# Patient Record
Sex: Male | Born: 1965 | Race: White | Hispanic: No | Marital: Married | Smoking: Former smoker
Health system: Southern US, Community
[De-identification: ages and names within clinical notes are randomized; demographics above are authoritative.]

## PROBLEM LIST (undated history)

## (undated) DIAGNOSIS — H9190 Unspecified hearing loss, unspecified ear: Secondary | ICD-10-CM

## (undated) HISTORY — DX: Unspecified hearing loss, unspecified ear: H91.90

---

## 2017-02-28 LAB — PULMONARY FUNCTION TEST

## 2017-04-20 ENCOUNTER — Ambulatory Visit (INDEPENDENT_AMBULATORY_CARE_PROVIDER_SITE_OTHER): Payer: Worker's Compensation | Admitting: Pulmonary Disease

## 2017-04-20 ENCOUNTER — Encounter: Payer: Self-pay | Admitting: Pulmonary Disease

## 2017-04-20 VITALS — BP 120/76 | HR 68 | Ht 69.0 in | Wt 210.0 lb

## 2017-04-20 DIAGNOSIS — R05 Cough: Secondary | ICD-10-CM

## 2017-04-20 DIAGNOSIS — R0602 Shortness of breath: Secondary | ICD-10-CM

## 2017-04-20 DIAGNOSIS — R059 Cough, unspecified: Secondary | ICD-10-CM

## 2017-04-20 MED ORDER — FLUTICASONE FUROATE-VILANTEROL 200-25 MCG/INH IN AEPB: 1.0000 | INHALATION_SPRAY | Freq: Every day | RESPIRATORY_TRACT | 0 refills | Status: DC

## 2017-04-20 MED ORDER — BENZONATATE 200 MG PO CAPS: 200.0000 mg | ORAL_CAPSULE | Freq: Three times a day (TID) | ORAL | 2 refills | Status: AC | PRN

## 2017-04-20 MED ORDER — FLUTICASONE FUROATE-VILANTEROL 200-25 MCG/INH IN AEPB: 1.0000 | INHALATION_SPRAY | Freq: Every day | RESPIRATORY_TRACT | 6 refills | Status: DC

## 2017-04-20 MED ORDER — FLUTICASONE FUROATE-VILANTEROL 200-25 MCG/INH IN AEPB: 1.0000 | INHALATION_SPRAY | Freq: Every day | RESPIRATORY_TRACT | 0 refills | Status: AC

## 2017-04-20 MED ORDER — PREDNISONE 10 MG PO TABS: ORAL_TABLET | ORAL | 0 refills | Status: DC

## 2017-04-20 MED ORDER — CHLORPHENIRAMINE MALEATE 4 MG PO TABS: 8.0000 mg | ORAL_TABLET | Freq: Three times a day (TID) | ORAL | 0 refills | Status: DC

## 2017-04-20 MED ORDER — OMEPRAZOLE 20 MG PO CPDR: 20.0000 mg | DELAYED_RELEASE_CAPSULE | Freq: Two times a day (BID) | ORAL | 6 refills | Status: DC

## 2017-04-20 NOTE — Patient Instructions (Signed)
Schedule you for high-resolution CT of the chest. Tessalon Perles for cough, use over-the-counter Robitussin We will start on chlorpheniramine 8 mg 3 times daily and Flonase nasal spray Start Prilosec 20 mg twice daily Stop the Qvar.  We will start you on Breo 200 Give a prednisone taper starting at 40 mg.  Reduce dose by 10 mg every 3 days Follow-up in 1 month with PFTs

## 2017-04-20 NOTE — Progress Notes (Signed)
Brandon GangJames W Mooneyham    [id number]    08-07-1965  Primary Care Physician:System, Pcp Not In  Referring Physician: No referring provider defined for this encounter.  Chief complaint: Dyspnea, occupational exposure  HPI: 52 year old with smoker with no significant history.  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  He reports that the bags had split open spilling the material in his truck and he was involved in the cleanup with no mask or other protection.  Reports developing cough, wheezing, dyspnea afterwards.  Denies any fevers, chills, sputum production, hemoptysis.  He has [facility] and Jan with a chest x-ray that was clear.  He was treated with ciprofloxacin, duo nebs and multiple rounds of prednisone.  He reports persistent symptoms and was seen again at [facility] on [facility].  Chest x-ray at that time shows mild interstitial changes consistent with bronchitis.  Spirometry did not show any obstruction, possible restriction.  He is currently on Qvar and albuterol per his primary care doctor but has persistent symptoms.  Pets: 3 dogs, no birds, cats, farm animals Occupation: Long distance truck driver Exposures: Exposure as noted above.  No mold.  Has a hot tub at home Smoking history: 10-pack-year smoking history.  Smokes a cigar every day.  Quit smoking 1 month ago Travel History: [place] in [place].  Travels all over the country for his work.  Outpatient Encounter Medications as of 04/20/2017  Medication Sig  . dexamethasone (DECADRON) 2 MG tablet   . LORazepam (ATIVAN) 1 MG tablet Take 1 mg by mouth every 8 (eight) hours.  Marland Kitchen. QVAR REDIHALER 80 MCG/ACT inhaler   . VENTOLIN HFA 108 (90 Base) MCG/ACT inhaler INHALE 2 PUFFS INTO THE LUNGS EVERY 4 HOURS AS NEEDED  FOR WHEEZING   No facility-administered encounter medications on file as of 04/20/2017.     Allergies as of 04/20/2017  . (Not on File)    Past Medical History:  Diagnosis Date  . Hearing loss     History reviewed. No pertinent surgical history.  Family History  Problem Relation Age of Onset  . Heart attack Father     Social History   Socioeconomic History  . Marital status: Married    Spouse name: Not on file  . Number of children: Not on file  . Years of education: Not on file  . Highest education level: Not on file  Social Needs  . Financial resource strain: Not on file  . Food insecurity - worry: Not on file  . Food insecurity - inability: Not on file  . Transportation needs - medical: Not on file  . Transportation needs - non-medical: Not on file  Occupational History  . Not on file  Tobacco Use  . Smoking status: Former Smoker    Packs/day: 1.00    Years: 10.00    Pack years: 10.00    Types: Cigars  . Smokeless tobacco: Never Used  Substance and Sexual Activity  . Alcohol use: No    Frequency: Never  . Drug use: No  . Sexual activity: Not on file  Other Topics Concern  . Not on file  Social History Narrative  . Not on file    Review of systems: Review of Systems  Constitutional: Negative for fever  and chills.  HENT: Negative.   Eyes: Negative for blurred vision.  Respiratory: as per HPI  Cardiovascular: Negative for chest pain and palpitations.  Gastrointestinal: Negative for vomiting, diarrhea, blood per rectum. Genitourinary: Negative for dysuria, urgency, frequency and hematuria.  Musculoskeletal: Negative for myalgias, back pain and joint pain.  Skin: Negative for itching and rash.  Neurological: Negative for dizziness, tremors, focal weakness, seizures and loss of consciousness.  Endo/Heme/Allergies: Negative for environmental allergies.  Psychiatric/Behavioral: Negative for depression, suicidal ideas and hallucinations.  All other  systems reviewed and are negative.  Physical Exam: Blood pressure 120/76, pulse 68, height 5\' 9"  (1.753 m), weight 210 lb (95.3 kg), SpO2 96 %. Gen:      No acute distress HEENT:  EOMI, sclera anicteric Neck:     No masses; no thyromegaly Lungs:    Clear to auscultation bilaterally; normal respiratory effort CV:         Regular rate and rhythm; no murmurs Abd:      + bowel sounds; soft, non-tender; no palpable masses, no distension Ext:    No edema; adequate peripheral perfusion Skin:      Warm and dry; no rash Neuro: alert and oriented x 3 Psych: normal mood and affect  Data Reviewed: Spirometry 02/28/17 FVC 2.48 [49%], FEV1 2.22 [57%], F/F 89 No obstruction, likely has restriction based on reduction in FVC.  FENO 04/21/17-unable to complete  Chest x-ray 02/14/17- clear lungs, no interstitial process Chest x-ray 04/08/17- mild increase in interstitial markings.  I have reviewed the images personally. ABG 02/14/17-7.49/30 3/1 72/100%  Assessment:  Assessment for acute dyspnea, wheezing Reports exposure to [facility] recently.  Review of literature shows that it is substitute for asbestos however there is no recorded instances of interstitial lung disease, pulmonary fibrosis after exposure  He could have acute bronchitis from exposure, suspect underlying COPD given his smoking history Has persistent cough which is likely exacerbated by postnasal drip and silent reflux He has used Tussionex in the past without any benefit. Use over-the-counter Robitussin and Tessalon for cough We will try him on chlorpheniramine antihistamine and Flonase nasal spray, start Prilosec for treatment of reflux Stop Qvar and start Breo Give another prednisone taper.    I doubt he will be able to complete PFTs right now as he has excessive coughing.  Will reassess in 1 month Get high-resolution CT for evaluation of interstitial lung disease given recent chest x-ray with interstitial opacities and  restriction on spirometry.  Plan/Recommendations: - High res CT -Tessalon Perles, Robitussin - Chlorphentermine, Flonase, Prilosec - Stop Qvar, start breo - Prednisone taper - Return in 1 month with PFTs.  Chilton Greathouse MD  Pulmonary and Critical Care Pager [phone] 04/20/2017, 11:18 AM  CC: No ref. provider found

## 2017-04-21 ENCOUNTER — Encounter: Payer: Self-pay | Admitting: Pulmonary Disease

## 2017-04-27 ENCOUNTER — Telehealth: Payer: Self-pay | Admitting: Pulmonary Disease

## 2017-04-28 NOTE — Telephone Encounter (Signed)
Spoke to [employer] auth @ is [id number] his appt is 05/02/17 Tobe SosSally E Ottinger

## 2017-04-28 NOTE — Telephone Encounter (Signed)
Lmtcb Sally E Ottinger ° ° °

## 2017-05-02 ENCOUNTER — Ambulatory Visit (INDEPENDENT_AMBULATORY_CARE_PROVIDER_SITE_OTHER)
Admission: RE | Admit: 2017-05-02 | Discharge: 2017-05-02 | Disposition: A | Discharge status: Home / Self Care | Payer: Worker's Compensation | Source: Ambulatory Visit | Attending: Pulmonary Disease | Admitting: Pulmonary Disease

## 2017-05-02 DIAGNOSIS — R0602 Shortness of breath: Secondary | ICD-10-CM

## 2017-05-10 ENCOUNTER — Telehealth: Payer: Self-pay | Admitting: Pulmonary Disease

## 2017-05-10 NOTE — Telephone Encounter (Signed)
[facility] Isaiah SergeMannam, has not received the CT results yet.

## 2017-05-12 NOTE — Telephone Encounter (Signed)
lmtcb x2 for Engineer, productionAlec nurse vase manager.

## 2017-05-12 NOTE — Telephone Encounter (Signed)
Alec nurse vase manager is calling back [phone]

## 2017-05-12 NOTE — Telephone Encounter (Signed)
Called and spoke with Erby Pianlec letting her know the CT scan has not been resulted yet but as soon as Dr. Isaiah SergeMannam reviewed the CT scan we could send the pt's previous OV as well as the CT scan results and pt's upcoming OV notes to her for their records.  Alec expressed understanding.  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  Routing this to Dr. Isaiah SergeMannam as an Lorain ChildesFYI

## 2017-05-16 NOTE — Telephone Encounter (Signed)
Still waiting on Dr. Isaiah SergeMannam to review results.

## 2017-05-17 NOTE — Telephone Encounter (Signed)
Called [place], LM to return call.

## 2017-05-17 NOTE — Telephone Encounter (Signed)
CT scan shows mild changes that may be from exposure. I am awaiting PFTs and office visit (to be done on 3/28). Will document my impression on the office visit note that day. Thanks  Chilton GreathousePraveen Threasa Kinch MD [place] [facility] 05/17/2017, 8:47 AM

## 2017-05-18 NOTE — Telephone Encounter (Signed)
Left message for Erby Pianlec to call back.

## 2017-05-18 NOTE — Telephone Encounter (Signed)
Erby Pianlec is calling back [phone]

## 2017-05-18 NOTE — Telephone Encounter (Signed)
LMTCB x 3 

## 2017-05-19 ENCOUNTER — Ambulatory Visit (INDEPENDENT_AMBULATORY_CARE_PROVIDER_SITE_OTHER): Payer: Worker's Compensation | Admitting: Pulmonary Disease

## 2017-05-19 ENCOUNTER — Encounter: Payer: Self-pay | Admitting: Pulmonary Disease

## 2017-05-19 VITALS — BP 124/76 | HR 88 | Ht 69.0 in | Wt 211.0 lb

## 2017-05-19 DIAGNOSIS — G4733 Obstructive sleep apnea (adult) (pediatric): Secondary | ICD-10-CM | POA: Diagnosis not present

## 2017-05-19 DIAGNOSIS — R05 Cough: Secondary | ICD-10-CM | POA: Diagnosis not present

## 2017-05-19 DIAGNOSIS — R059 Cough, unspecified: Secondary | ICD-10-CM

## 2017-05-19 MED ORDER — CHLORPHENIRAMINE MALEATE 4 MG PO TABS
8.0000 mg | ORAL_TABLET | Freq: Three times a day (TID) | ORAL | 3 refills | Status: AC
Start: 2017-05-19 — End: ?

## 2017-05-19 MED ORDER — PREDNISONE 10 MG PO TABS: ORAL_TABLET | ORAL | 0 refills | Status: DC

## 2017-05-19 MED ORDER — ALBUTEROL SULFATE HFA 108 (90 BASE) MCG/ACT IN AERS: 2.0000 | INHALATION_SPRAY | Freq: Four times a day (QID) | RESPIRATORY_TRACT | 6 refills | Status: DC | PRN

## 2017-05-19 MED ORDER — BUDESONIDE-FORMOTEROL FUMARATE 160-4.5 MCG/ACT IN AERO: 2.0000 | INHALATION_SPRAY | Freq: Two times a day (BID) | RESPIRATORY_TRACT | 0 refills | Status: DC

## 2017-05-19 MED ORDER — FLUTICASONE PROPIONATE 50 MCG/ACT NA SUSP: 2.0000 | Freq: Every day | NASAL | 2 refills | Status: AC

## 2017-05-19 NOTE — Addendum Note (Signed)
Addended by: Cydney OkAUGUSTIN, Sun Wilensky N on: 05/19/2017 01:03 PM   Modules accepted: Orders

## 2017-05-19 NOTE — Addendum Note (Signed)
Addended by: Cydney OkAUGUSTIN, Everardo Voris N on: 05/19/2017 01:05 PM   Modules accepted: Orders

## 2017-05-19 NOTE — Patient Instructions (Addendum)
Schedule you for a split-night sleep study Stop Breo.  We will start you on Symbicort 160.  Use 2 puffs twice daily We will give you albuterol rescue inhaler to be used as needed  Restart chlorpheniramine 8 mg 3 times daily and Flonase nasal spray Continue Prilosec 2 tablets twice daily We will give you another prednisone taper starting at 40 mg.  Reduce dose by 10 mg every 4 days. Follow-up CT in 1 year to monitor 6mm pulmonary nodule.  Follow-up in 1 month with PFTs.

## 2017-05-19 NOTE — Telephone Encounter (Signed)
lmtcb for [employer].

## 2017-05-19 NOTE — Progress Notes (Signed)
Brandon Chang    [id number]    May 08, 1965  Primary Care Physician:System, Pcp Not In  Referring Physician: No referring provider defined for this encounter.  Chief complaint: Dyspnea, cough, occupational exposure  HPI: 52 year old with smoker with no significant history.  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  He reports that the bags had split open spilling the material in his truck and he was involved in the cleanup with no mask or other protection.  Reports developing cough, wheezing, dyspnea afterwards.  Denies any fevers, chills, sputum production, hemoptysis.  He has been evaluated in the urgent care and emergency room at [facility] end of December 2018 and Jan with a chest x-ray that was clear.  He was treated with ciprofloxacin, duo nebs and multiple rounds of prednisone.  He reports persistent symptoms and was seen again at urgent care at [facility] on 04/08/17.  Chest x-ray at that time shows mild interstitial changes consistent with bronchitis.  Spirometry did not show any obstruction, possible restriction.  He is currently on Qvar and albuterol per his primary care doctor but has persistent symptoms.  Pets: 3 dogs, no birds, cats, farm animals Occupation: Long distance truck driver Exposures: Exposure as noted above.  No mold.  Has a hot tub at home Smoking history: 10-pack-year smoking history.  Smokes a cigar every day.  Quit smoking 1 month ago Travel History: Lives in [place].  Travels all over the country for his work.  Interim history: Reports mild improvement in symptoms however he continues to have paroxysms of cough.  States that dyspnea is better Has poor sleep quality, excessive daytime sleepiness, snoring.  Also has increased back spasms.  Outpatient Encounter Medications as of 05/19/2017  Medication Sig  . benzonatate  (TESSALON) 200 MG capsule Take 1 capsule (200 mg total) by mouth 3 (three) times daily as needed for cough.  . chlorpheniramine (CHLOR-TRIMETON) 4 MG tablet Take 2 tablets (8 mg total) by mouth 3 (three) times daily.  Marland Kitchen dexamethasone (DECADRON) 2 MG tablet   . fluticasone furoate-vilanterol (BREO ELLIPTA) 200-25 MCG/INH AEPB Inhale 1 puff into the lungs daily.  Marland Kitchen LORazepam (ATIVAN) 1 MG tablet Take 1 mg by mouth every 8 (eight) hours.  Marland Kitchen omeprazole (PRILOSEC) 20 MG capsule Take 1 capsule (20 mg total) by mouth 2 (two) times daily.  . VENTOLIN HFA 108 (90 Base) MCG/ACT inhaler INHALE 2 PUFFS INTO THE LUNGS EVERY 4 HOURS AS NEEDED FOR WHEEZING  . [DISCONTINUED] predniSONE (DELTASONE) 10 MG tablet 4 tabs x 3 days, 3 tabs x 3 days, 2 tabs x 3 days, 1 tab x 3 days then stop   No facility-administered encounter medications on file as of 05/19/2017.     Allergies as of 05/19/2017  . (Not on File)    Past Medical History:  Diagnosis Date  . Hearing loss     No past surgical history on file.  Family History  Problem Relation Age of Onset  . Heart attack Father     Social History   Socioeconomic History  . Marital status: Married    Spouse name: Not on file  . Number of children: Not on file  . Years of education: Not on file  . Highest education level: Not on file  Occupational History  . Not on file  Social Needs  . Financial  resource strain: Not on file  . Food insecurity:    Worry: Not on file    Inability: Not on file  . Transportation needs:    Medical: Not on file    Non-medical: Not on file  Tobacco Use  . Smoking status: Former Smoker    Packs/day: 1.00    Years: 10.00    Pack years: 10.00    Types: Cigars  . Smokeless tobacco: Never Used  . Tobacco comment: quit smoking 1y ago--04/20/17  Substance and Sexual Activity  . Alcohol use: No    Frequency: Never  . Drug use: No  . Sexual activity: Not on file  Lifestyle  . Physical activity:    Days per week: Not  on file    Minutes per session: Not on file  . Stress: Not on file  Relationships  . Social connections:    Talks on phone: Not on file    Gets together: Not on file    Attends religious service: Not on file    Active member of club or organization: Not on file    Attends meetings of clubs or organizations: Not on file    Relationship status: Not on file  . Intimate partner violence:    Fear of current or ex partner: Not on file    Emotionally abused: Not on file    Physically abused: Not on file    Forced sexual activity: Not on file  Other Topics Concern  . Not on file  Social History Narrative  . Not on file    Review of systems: Review of Systems  Constitutional: Negative for fever and chills.  HENT: Negative.   Eyes: Negative for blurred vision.  Respiratory: as per HPI  Cardiovascular: Negative for chest pain and palpitations.  Gastrointestinal: Negative for vomiting, diarrhea, blood per rectum. Genitourinary: Negative for dysuria, urgency, frequency and hematuria.  Musculoskeletal: Negative for myalgias, back pain and joint pain.  Skin: Negative for itching and rash.  Neurological: Negative for dizziness, tremors, focal weakness, seizures and loss of consciousness.  Endo/Heme/Allergies: Negative for environmental allergies.  Psychiatric/Behavioral: Negative for depression, suicidal ideas and hallucinations.  All other systems reviewed and are negative.  Physical Exam: Blood pressure 124/76, pulse 88, height 5\' 9"  (1.753 m), weight 211 lb (95.7 kg), SpO2 97 %. Gen:      No acute distress HEENT:  EOMI, sclera anicteric Neck:     No masses; no thyromegaly Lungs:    Clear to auscultation bilaterally; normal respiratory effort CV:         Regular rate and rhythm; no murmurs Abd:      + bowel sounds; soft, non-tender; no palpable masses, no distension Ext:    No edema; adequate peripheral perfusion Skin:      Warm and dry; no rash Neuro: alert and oriented x 3 Psych:  normal mood and affect  Data Reviewed: Spirometry 02/28/17 FVC 2.48 [49%], FEV1 2.22 [57%], F/F 89 No obstruction, likely has restriction based on reduction in FVC.  FENO 04/21/17-unable to complete PFTs 05/19/17-unable to complete  Chest x-ray 02/14/17- clear lungs, no interstitial process Chest x-ray 04/08/17- mild increase in interstitial markings.  CT chest 05/02/17-minimal groundglass opacities, basilar atelectasis.  6 mm left lower lobe nodule. I reviewed the images personally. ABG 02/14/17-7.49/30 3/1 72/100%  Assessment:  Follow up for dyspnea, cough [facility] recently.  Review of literature shows that it is substitute for asbestos however there is no recorded instances of interstitial lung  disease, pulmonary fibrosis after exposure.  Review of his CT scan is not convincing for interstitial lung disease.  He could have acute bronchitis from exposure, suspect underlying COPD given his smoking history Has persistent cough which is likely exacerbated by postnasal drip and silent reflux  Cough is improved since last visit but still continues to limit function. We will restart him on chlorpheniramine antihistamine, Flonase nasal spray.  Continue Prilosec for treatment of acid reflux Stop Breo as powder formulation is causing more cough.  Start Symbicort 160/45 Give another prednisone taper.  Subcentimeter pulmonary nodule He will need a follow-up CT in 1 year for evaluation  Suspected sleep apnea Untreated sleep apnea may contribute to ongoing cough Schedule for a split-night sleep study.  Asked by his case manager if he can return to work.  From a pulmonary perspective there is no clear evidence that his exposure to [facility] is causing issues however he has paroxysmal cough and also now reports excessive daytime sleepiness which may limit his ability to try heavy trucks.  I would advise that he not return to work until this has improved and he has been  evaluated for sleep apnea.  Plan/Recommendations: - Lawyer, Robitussin - Chlorphentermine, Flonase, Prilosec - Stop breo, start Symbicort - Repeat prednisone taper  - Return in 1 month with PFTs. - Follow up CT in 1 year.  - Split night sleep study  Chilton Greathouse MD [place] [facility] Pager [phone] 05/19/2017, 12:09 PM  CC: No ref. provider found

## 2017-05-19 NOTE — Progress Notes (Signed)
Patient was unable to complete PFT due to coughing and not following instructions.

## 2017-05-23 ENCOUNTER — Telehealth: Payer: Self-pay | Admitting: Pulmonary Disease

## 2017-05-23 NOTE — Telephone Encounter (Signed)
[facility] She is the pt's nurse case manager. Erby Pianlec is needing OV notes from 04/20/17 and 05/19/17, along with the pt's CT from 05/02/17. [facility]. Nothing further was needed.

## 2017-06-05 ENCOUNTER — Encounter (HOSPITAL_BASED_OUTPATIENT_CLINIC_OR_DEPARTMENT_OTHER): Payer: Self-pay

## 2017-06-20 ENCOUNTER — Ambulatory Visit (INDEPENDENT_AMBULATORY_CARE_PROVIDER_SITE_OTHER): Payer: Worker's Compensation | Admitting: Pulmonary Disease

## 2017-06-20 ENCOUNTER — Ambulatory Visit: Payer: Worker's Compensation | Admitting: Pulmonary Disease

## 2017-06-20 ENCOUNTER — Encounter: Payer: Self-pay | Admitting: Pulmonary Disease

## 2017-06-20 VITALS — BP 126/78 | HR 81 | Ht 69.0 in | Wt 204.0 lb

## 2017-06-20 DIAGNOSIS — R05 Cough: Secondary | ICD-10-CM

## 2017-06-20 DIAGNOSIS — R059 Cough, unspecified: Secondary | ICD-10-CM

## 2017-06-20 MED ORDER — ALBUTEROL SULFATE HFA 108 (90 BASE) MCG/ACT IN AERS: 2.0000 | INHALATION_SPRAY | Freq: Four times a day (QID) | RESPIRATORY_TRACT | 0 refills | Status: AC | PRN

## 2017-06-20 MED ORDER — BUDESONIDE-FORMOTEROL FUMARATE 160-4.5 MCG/ACT IN AERO: 2.0000 | INHALATION_SPRAY | Freq: Two times a day (BID) | RESPIRATORY_TRACT | 1 refills | Status: DC

## 2017-06-20 MED ORDER — OMEPRAZOLE 20 MG PO CPDR: 20.0000 mg | DELAYED_RELEASE_CAPSULE | Freq: Two times a day (BID) | ORAL | 1 refills | Status: AC

## 2017-06-20 NOTE — Progress Notes (Signed)
Unable to complete PFT. During attempt of spirometry and DLCO, pt start coughing vigorously, turning red in the face, and gagging uncontrollably. MD notified and test was terminated.

## 2017-06-20 NOTE — Progress Notes (Signed)
Brandon Chang    [id number]    07/04/1965  Primary Care Physician:System, Pcp Not In  Referring Physician: No referring provider defined for this encounter.  Chief complaint: Dyspnea, cough, occupational exposure  HPI: 52 year old with smoker with no significant history.  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  He reports that the bags had split open spilling the material in his truck and he was involved in the cleanup with no mask or other protection.  Reports developing cough, wheezing, dyspnea afterwards.  Denies any fevers, chills, sputum production, hemoptysis.  He has been evaluated in the urgent care and emergency room at [facility] end of December 2018 and Jan with a chest x-ray that was clear.  He was treated with ciprofloxacin, duo nebs and multiple rounds of prednisone.  He reports persistent symptoms and was seen again at urgent care at [facility] on 04/08/17.  Chest x-ray at that time shows mild interstitial changes consistent with bronchitis.  Spirometry did not show any obstruction, possible restriction.  He is currently on Qvar and albuterol per his primary care doctor but has persistent symptoms.  Pets: 3 dogs, no birds, cats, farm animals Occupation: Long distance truck driver Exposures: Exposure as noted above.  No mold.  Has a hot tub at home Smoking history: 10-pack-year smoking history.  Smokes a cigar every day.  Quit smoking 1 month ago Travel History: Lives in [place].  Travels all over the country for his work.  Interim history: Reports mild improvement in cough however he continues to have paroxysms of cough Denies any dyspnea. Unable to complete PFTs on 2 occasions due to ongoing cough Sleep study ordered but denied by his Worker's Compensation.  Outpatient Encounter Medications as of 06/20/2017  Medication Sig  .  albuterol (PROVENTIL HFA;VENTOLIN HFA) 108 (90 Base) MCG/ACT inhaler Inhale 2 puffs into the lungs every 6 (six) hours as needed for wheezing or shortness of breath.  . benzonatate (TESSALON) 200 MG capsule Take 1 capsule (200 mg total) by mouth 3 (three) times daily as needed for cough.  . budesonide-formoterol (SYMBICORT) 160-4.5 MCG/ACT inhaler Inhale 2 puffs into the lungs 2 (two) times daily.  . chlorpheniramine (CHLOR-TRIMETON) 4 MG tablet Take 2 tablets (8 mg total) by mouth 3 (three) times daily.  Marland Kitchen dexamethasone (DECADRON) 2 MG tablet   . fluticasone (FLONASE) 50 MCG/ACT nasal spray Place 2 sprays into both nostrils daily.  Marland Kitchen LORazepam (ATIVAN) 1 MG tablet Take 1 mg by mouth every 8 (eight) hours.  . [DISCONTINUED] chlorpheniramine (CHLOR-TRIMETON) 4 MG tablet Take 2 tablets (8 mg total) by mouth 3 (three) times daily.  . [DISCONTINUED] fluticasone furoate-vilanterol (BREO ELLIPTA) 200-25 MCG/INH AEPB Inhale 1 puff into the lungs daily.  . [DISCONTINUED] predniSONE (DELTASONE) 10 MG tablet Take 4 tabs by mouth once daily x4 days, then 3 tabs x4 days, 2 tabs x4 days, 1 tab x4 days and stop.  . [DISCONTINUED] VENTOLIN HFA 108 (90 Base) MCG/ACT inhaler INHALE 2 PUFFS INTO THE LUNGS EVERY 4 HOURS AS NEEDED FOR WHEEZING  . omeprazole (PRILOSEC) 20 MG capsule Take 1 capsule (20 mg total) by mouth 2 (two) times daily. (Patient not taking: Reported on 06/20/2017)   No facility-administered encounter medications on file as of 06/20/2017.     Allergies as of 06/20/2017  . (Not on File)    Past  Medical History:  Diagnosis Date  . Hearing loss     No past surgical history on file.  Family History  Problem Relation Age of Onset  . Heart attack Father     Social History   Socioeconomic History  . Marital status: Married    Spouse name: Not on file  . Number of children: Not on file  . Years of education: Not on file  . Highest education level: Not on file  Occupational History  .  Not on file  Social Needs  . Financial resource strain: Not on file  . Food insecurity:    Worry: Not on file    Inability: Not on file  . Transportation needs:    Medical: Not on file    Non-medical: Not on file  Tobacco Use  . Smoking status: Former Smoker    Packs/day: 1.00    Years: 10.00    Pack years: 10.00    Types: Cigars  . Smokeless tobacco: Never Used  . Tobacco comment: quit smoking 1y ago--04/20/17  Substance and Sexual Activity  . Alcohol use: No    Frequency: Never  . Drug use: No  . Sexual activity: Not on file  Lifestyle  . Physical activity:    Days per week: Not on file    Minutes per session: Not on file  . Stress: Not on file  Relationships  . Social connections:    Talks on phone: Not on file    Gets together: Not on file    Attends religious service: Not on file    Active member of club or organization: Not on file    Attends meetings of clubs or organizations: Not on file    Relationship status: Not on file  . Intimate partner violence:    Fear of current or ex partner: Not on file    Emotionally abused: Not on file    Physically abused: Not on file    Forced sexual activity: Not on file  Other Topics Concern  . Not on file  Social History Narrative  . Not on file    Review of systems: Review of Systems  Constitutional: Negative for fever and chills.  HENT: Negative.   Eyes: Negative for blurred vision.  Respiratory: as per HPI  Cardiovascular: Negative for chest pain and palpitations.  Gastrointestinal: Negative for vomiting, diarrhea, blood per rectum. Genitourinary: Negative for dysuria, urgency, frequency and hematuria.  Musculoskeletal: Negative for myalgias, back pain and joint pain.  Skin: Negative for itching and rash.  Neurological: Negative for dizziness, tremors, focal weakness, seizures and loss of consciousness.  Endo/Heme/Allergies: Negative for environmental allergies.  Psychiatric/Behavioral: Negative for depression,  suicidal ideas and hallucinations.  All other systems reviewed and are negative.  Physical Exam: Blood pressure 126/78, pulse 81, height  (1.753 m), weight 204 lb (92.5 kg), SpO2 94 %. Gen:      No acute distress HEENT:  EOMI, sclera anicteric Neck:     No masses; no thyromegaly Lungs:    Clear to auscultation bilaterally; normal respiratory effort CV:         Regular rate and rhythm; no murmurs Abd:      + bowel sounds; soft, non-tender; no palpable masses, no distension Ext:    No edema; adequate peripheral perfusion Skin:      Warm and dry; no rash Neuro: alert and oriented x 3 Psych: normal mood and affect  Data Reviewed: Spirometry 02/28/17 FVC 2.48 [49%], FEV1 2.22 [57%], F/F  89 No obstruction, likely has restriction based on reduction in FVC.  FENO 04/21/17-unable to complete PFTs 05/19/17-unable to complete PFTs 06/20/17-unable to complete  Chest x-ray 02/14/17- clear lungs, no interstitial process Chest x-ray 04/08/17- mild increase in interstitial markings.  CT chest 05/02/17-minimal groundglass opacities, basilar atelectasis.  6 mm left lower lobe nodule. I reviewed the images personally. ABG 02/14/17-7.49/30 3/1 72/100%  Assessment:  Follow up for dyspnea, cough Developed after inhalational exposure to [place]oll[place]ite in dec 2018.  Review of literature shows that it is substitute for asbestos however there is no recorded instances of interstitial lung disease, pulmonary fibrosis after exposure.  His CT scan is not convincing for interstitial lung disease.  He could have bronchitis from exposure, suspect underlying COPD given his smoking history but we have not been able to get PFTs due to cough. Has persistent cough which is likely exacerbated by postnasal drip and silent reflux  Continue chlorphentermine, Flonase nasal spray Continue Prilosec 20 mg twice daily given that he does not have overt symptoms of acid reflux On Symbicort 160/45.  Subcentimeter pulmonary  nodule He will need a follow-up CT in 1 year for evaluation  Plan/Recommendations: - Chlorphentermine, Flonase, Prilosec - Symbicort  Chilton Greathouse MD [facility] Pager [phone] 06/20/2017, 4:25 PM  CC: No ref. provider found

## 2017-06-20 NOTE — Patient Instructions (Signed)
Continue using the Symbicort Continue chlorphentermine and Flonase nasal spray Make sure that you use Prilosec twice daily Follow-up in 6 months

## 2017-07-21 ENCOUNTER — Telehealth: Payer: Self-pay | Admitting: Pulmonary Disease

## 2017-07-21 NOTE — Telephone Encounter (Signed)
Faxed pt's ct scan to Clydie Braun, nurse case mang.  Attempted to call Clydie Braun stating that this had been done but phone went straight to VM.  Left a detailed message for Clydie Braun stating that the scan had been faxed to her.  Nothing further needed at this time.

## 2017-08-19 ENCOUNTER — Telehealth: Payer: Self-pay | Admitting: Pulmonary Disease

## 2017-08-19 NOTE — Telephone Encounter (Signed)
Clydie BraunKaren is aware of below message and voiced her understanding. lmtcb x1 to schedule pt.

## 2017-08-19 NOTE — Telephone Encounter (Signed)
That will be fine.  Please order high resolution CT for evaluation of ILD and make follow up appointment.

## 2017-08-19 NOTE — Telephone Encounter (Signed)
Called and spoke with Clydie BraunKaren, Sports coachcase manager.  She stated that she felt that he needed another CT chest and follow up OV. She stated that [place] 2 [place], and the Patient refused.  She wants to get him more treatment and she felt that Dr. Isaiah SergeMannam has done a great job with treatment in the past.  She was informed that the message would be sent to Dr. Isaiah SergeMannam.  Dr. Isaiah SergeMannam please advise

## 2017-08-22 NOTE — Telephone Encounter (Signed)
Spoke with pt. He was under the impression that he did not need a follow up until October. Pt is correct and a recall has already been placed. Nothing further was needed.

## 2017-08-24 ENCOUNTER — Telehealth: Payer: Self-pay | Admitting: Pulmonary Disease

## 2017-08-24 DIAGNOSIS — J849 Interstitial pulmonary disease, unspecified: Secondary | ICD-10-CM

## 2017-08-24 NOTE — Telephone Encounter (Signed)
Called and spoke to pt's case manager, Clydie BraunKaren.  Clydie BraunKaren is requesting sooner apt then October for pt.  I have spoken to pt, and scheduled OV for 09/02/17.  CT has been ordered to be performed prior to OV. Nothing further is needed.

## 2017-09-01 ENCOUNTER — Encounter (INDEPENDENT_AMBULATORY_CARE_PROVIDER_SITE_OTHER): Payer: Self-pay

## 2017-09-01 ENCOUNTER — Ambulatory Visit (INDEPENDENT_AMBULATORY_CARE_PROVIDER_SITE_OTHER)
Admission: RE | Admit: 2017-09-01 | Discharge: 2017-09-01 | Disposition: A | Discharge status: Home / Self Care | Payer: Worker's Compensation | Source: Ambulatory Visit | Attending: Pulmonary Disease | Admitting: Pulmonary Disease

## 2017-09-01 DIAGNOSIS — J849 Interstitial pulmonary disease, unspecified: Secondary | ICD-10-CM | POA: Diagnosis not present

## 2017-09-02 ENCOUNTER — Encounter: Payer: Self-pay | Admitting: Pulmonary Disease

## 2017-09-02 ENCOUNTER — Ambulatory Visit (INDEPENDENT_AMBULATORY_CARE_PROVIDER_SITE_OTHER): Payer: Worker's Compensation | Admitting: Pulmonary Disease

## 2017-09-02 VITALS — BP 110/74 | HR 63 | Ht 70.0 in | Wt 204.0 lb

## 2017-09-02 DIAGNOSIS — R059 Cough, unspecified: Secondary | ICD-10-CM

## 2017-09-02 DIAGNOSIS — R0602 Shortness of breath: Secondary | ICD-10-CM

## 2017-09-02 DIAGNOSIS — R05 Cough: Secondary | ICD-10-CM | POA: Diagnosis not present

## 2017-09-02 MED ORDER — PREDNISONE 20 MG PO TABS: ORAL_TABLET | ORAL | 1 refills | Status: DC

## 2017-09-02 MED ORDER — FLUTICASONE-UMECLIDIN-VILANT 100-62.5-25 MCG/INH IN AEPB: 1.0000 | INHALATION_SPRAY | Freq: Every day | RESPIRATORY_TRACT | 0 refills | Status: DC

## 2017-09-02 NOTE — Progress Notes (Signed)
Brandon Chang    [id number]    1965-06-21  Primary Care Physician:System, Pcp Not In  Referring Physician: No referring provider defined for this encounter.  Chief complaint: Dyspnea, cough, occupational exposure  HPI: 52 year old with smoker with no significant history.  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  He reports that the bags had split open spilling the material in his truck and he was involved in the cleanup with no mask or other protection.  Reports developing cough, wheezing, dyspnea afterwards.  Denies any fevers, chills, sputum production, hemoptysis.  He has been evaluated in the urgent care and emergency room at [facility] end of December 2018 and Jan with a chest x-ray that was clear.  He was treated with ciprofloxacin, duo nebs and multiple rounds of prednisone.  He reports persistent symptoms and was seen again at urgent care at [facility] on 04/08/17.  Chest x-ray at that time shows mild interstitial changes consistent with bronchitis.  Spirometry did not show any obstruction, possible restriction.  He is currently on Qvar and albuterol per his primary care doctor but has persistent symptoms.  Pets: 3 dogs, no birds, cats, farm animals Occupation: Long distance truck driver Exposures: Exposure as noted above.  No mold.  Has a hot tub at home Smoking history: 10-pack-year smoking history.  Smokes a cigar every day.  Quit smoking 1 month ago Travel History: Lives in [place].  Travels all over the country for his work.  Interim history: States that cough has improved.  However he continues to have exertion, dyspnea  Outpatient Encounter Medications as of 09/02/2017  Medication Sig  . albuterol (PROVENTIL HFA;VENTOLIN HFA) 108 (90 Base) MCG/ACT inhaler Inhale 2 puffs into the lungs every 6 (six) hours as needed for wheezing or  shortness of breath.  . benzonatate (TESSALON) 200 MG capsule Take 1 capsule (200 mg total) by mouth 3 (three) times daily as needed for cough.  . budesonide-formoterol (SYMBICORT) 160-4.5 MCG/ACT inhaler Inhale 2 puffs into the lungs 2 (two) times daily.  . chlorpheniramine (CHLOR-TRIMETON) 4 MG tablet Take 2 tablets (8 mg total) by mouth 3 (three) times daily.  Marland Kitchen dexamethasone (DECADRON) 2 MG tablet   . fluticasone (FLONASE) 50 MCG/ACT nasal spray Place 2 sprays into both nostrils daily.  Marland Kitchen LORazepam (ATIVAN) 1 MG tablet Take 1 mg by mouth every 8 (eight) hours.  Marland Kitchen omeprazole (PRILOSEC) 20 MG capsule Take 1 capsule (20 mg total) by mouth 2 (two) times daily.   No facility-administered encounter medications on file as of 09/02/2017.     Allergies as of 09/02/2017  . (Not on File)    Past Medical History:  Diagnosis Date  . Hearing loss     No past surgical history on file.  Family History  Problem Relation Age of Onset  . Heart attack Father     Social History   Socioeconomic History  . Marital status: Married    Spouse name: Not on file  . Number of children: Not on file  . Years of education: Not on file  . Highest education level: Not on file  Occupational History  . Not on file  Social Needs  . Financial resource strain: Not on file  . Food insecurity:    Worry: Not on file    Inability: Not on file  . Transportation needs:  Medical: Not on file    Non-medical: Not on file  Tobacco Use  . Smoking status: Former Smoker    Packs/day: 1.00    Years: 10.00    Pack years: 10.00    Types: Cigars  . Smokeless tobacco: Never Used  . Tobacco comment: quit smoking 1y ago--04/20/17  Substance and Sexual Activity  . Alcohol use: No    Frequency: Never  . Drug use: No  . Sexual activity: Not on file  Lifestyle  . Physical activity:    Days per week: Not on file    Minutes per session: Not on file  . Stress: Not on file  Relationships  . Social connections:     Talks on phone: Not on file    Gets together: Not on file    Attends religious service: Not on file    Active member of club or organization: Not on file    Attends meetings of clubs or organizations: Not on file    Relationship status: Not on file  . Intimate partner violence:    Fear of current or ex partner: Not on file    Emotionally abused: Not on file    Physically abused: Not on file    Forced sexual activity: Not on file  Other Topics Concern  . Not on file  Social History Narrative  . Not on file    Review of systems: Review of Systems  Constitutional: Negative for fever and chills.  HENT: Negative.   Eyes: Negative for blurred vision.  Respiratory: as per HPI  Cardiovascular: Negative for chest pain and palpitations.  Gastrointestinal: Negative for vomiting, diarrhea, blood per rectum. Genitourinary: Negative for dysuria, urgency, frequency and hematuria.  Musculoskeletal: Negative for myalgias, back pain and joint pain.  Skin: Negative for itching and rash.  Neurological: Negative for dizziness, tremors, focal weakness, seizures and loss of consciousness.  Endo/Heme/Allergies: Negative for environmental allergies.  Psychiatric/Behavioral: Negative for depression, suicidal ideas and hallucinations.  All other systems reviewed and are negative.  Physical Exam: Blood pressure 126/78, pulse 81, height 5\' 9"  (1.753 m), weight 204 lb (92.5 kg), SpO2 94 %. Gen:      No acute distress HEENT:  EOMI, sclera anicteric Neck:     No masses; no thyromegaly Lungs:    Clear to auscultation bilaterally; normal respiratory effort CV:         Regular rate and rhythm; no murmurs Abd:      + bowel sounds; soft, non-tender; no palpable masses, no distension Ext:    No edema; adequate peripheral perfusion Skin:      Warm and dry; no rash Neuro: alert and oriented x 3 Psych: normal mood and affect  Data Reviewed: Spirometry 02/28/17 FVC 2.48 [49%], FEV1 2.22 [57%], F/F 89 No  obstruction, likely has restriction based on reduction in FVC.  FENO 04/21/17-unable to complete PFTs 05/19/17-unable to complete PFTs 06/20/17-unable to complete  Chest x-ray 02/14/17- clear lungs, no interstitial process Chest x-ray 04/08/17- mild increase in interstitial markings.  CT chest 05/02/17-minimal groundglass opacities, basilar atelectasis.  6 mm left lower lobe nodule. CT high-resolution 08/30/2017-stable minimal groundglass opacities, bibasilar atelectasis, stable lung nodule. I have reviewed the images personally.  ABG 02/14/17-7.49/30 3/1 72/100%  Assessment:  Follow up for dyspnea, cough Developed after inhalational exposure to [place] in dec 2018.  Review of literature shows that it is substitute for asbestos however there is no recorded instances of interstitial lung disease, pulmonary fibrosis after exposure.  Repeat high-resolution CT  reviewed with mild groundglass opacities.  Does not and likely convincing for interstitial lung disease  He could have bronchitis from exposure, suspect underlying COPD given his smoking history but we have not been able to get PFTs due to cough. Has persistent cough which is likely exacerbated by postnasal drip and silent reflux   Stop Symbicort.  Start trelegy Schedule pulmonary function tests. He may be able to complete the test now as his cough is better  Also suspect sleep apnea given snoring, excessive daytime sleepiness, fatigue.  Will reorder sleep study.  Previous sleep study had been denied by his case Production designer, theatre/television/filmmanager. Check some basic labs including metabolic panel, CBC, blood allergy profile, TSH  Subcentimeter pulmonary nodule He will need a follow-up CT in 1 year for evaluation  Plan/Recommendations: - Symbicort, start trelegy - Prednisone, slow taper - Pulmonary function test - Sleep study - Metabolic panel, CBC, blood allergy profile, TSH  Chilton GreathousePraveen Mayda Shippee MD [facility] 09/02/2017, 1:54 PM  CC: No  ref. provider found

## 2017-09-02 NOTE — Patient Instructions (Addendum)
We will check some blood test today including comprehensive metabolic panel, CBC, blood allergy profile, TSH We will order pulmonary function test, stop Symbicort.  Start trelegy We will schedule you for a split-night sleep study. Start prednisone at 40 mg.  Reduce dose by 10 mg every 2 weeks Follow-up in 1 to 2 months.

## 2017-09-27 ENCOUNTER — Telehealth: Payer: Self-pay | Admitting: Pulmonary Disease

## 2017-09-27 NOTE — Telephone Encounter (Signed)
lmtcb for Anadarko Petroleum CorporationDarlene

## 2017-09-28 NOTE — Telephone Encounter (Signed)
Attempted to call patient today. I did not receive an answer at time of call. I have left a voicemail message for pt to return call. X2 

## 2017-09-28 NOTE — Telephone Encounter (Signed)
Agustin CreeDarlene is calling back [phone]#[id number]

## 2017-09-28 NOTE — Telephone Encounter (Signed)
Spoke with Agustin CreeDarlene  She is asking about pt's work status  I advised that this was not discussed last ov according to the notes Pt has upcoming appt in Sept 2019 with PFT  [name] stated this was all the info she needed

## 2017-09-30 ENCOUNTER — Other Ambulatory Visit: Payer: Self-pay | Admitting: Pulmonary Disease

## 2017-10-12 ENCOUNTER — Telehealth: Payer: Self-pay | Admitting: Pulmonary Disease

## 2017-10-12 NOTE — Telephone Encounter (Signed)
Called nurse case Production designer, theatre/television/filmmanager. Unable to reach. Left message to give us a call back.

## 2017-10-13 NOTE — Telephone Encounter (Signed)
LMTCB for Erby PianAlec, nurse case manager.

## 2017-10-17 NOTE — Telephone Encounter (Signed)
Left detailed message for Erby Pianlec stating that I would go ahead and fax over the OV notes.   Will close this encounter.

## 2017-11-10 ENCOUNTER — Encounter: Payer: Self-pay | Admitting: Pulmonary Disease

## 2017-11-10 ENCOUNTER — Ambulatory Visit: Payer: Worker's Compensation | Admitting: Pulmonary Disease

## 2017-11-10 ENCOUNTER — Ambulatory Visit (INDEPENDENT_AMBULATORY_CARE_PROVIDER_SITE_OTHER): Payer: Worker's Compensation | Admitting: Pulmonary Disease

## 2017-11-10 VITALS — BP 132/58 | HR 87 | Ht 68.0 in | Wt 210.0 lb

## 2017-11-10 DIAGNOSIS — R911 Solitary pulmonary nodule: Secondary | ICD-10-CM

## 2017-11-10 DIAGNOSIS — R059 Cough, unspecified: Secondary | ICD-10-CM

## 2017-11-10 DIAGNOSIS — R0600 Dyspnea, unspecified: Secondary | ICD-10-CM | POA: Diagnosis not present

## 2017-11-10 DIAGNOSIS — R05 Cough: Secondary | ICD-10-CM

## 2017-11-10 DIAGNOSIS — R0602 Shortness of breath: Secondary | ICD-10-CM

## 2017-11-10 LAB — PULMONARY FUNCTION TEST
FEF 25-75 PRE: 2.55 L/s
FEF2575-%Pred-Pre: 80 %
FEV1-%PRED-PRE: 58 %
FEV1-PRE: 2.1 L
FEV1FVC-%Pred-Pre: 117 %
FEV6-%PRED-PRE: 51 %
FEV6-Pre: 2.31 L
FEV6FVC-%Pred-Pre: 104 %
FVC-%Pred-Pre: 49 %
FVC-Pre: 2.31 L
Pre FEV1/FVC ratio: 91 %
Pre FEV6/FVC Ratio: 100 %

## 2017-11-10 MED ORDER — FLUTICASONE-UMECLIDIN-VILANT 100-62.5-25 MCG/INH IN AEPB: 1.0000 | INHALATION_SPRAY | Freq: Every day | RESPIRATORY_TRACT | 3 refills | Status: AC

## 2017-11-10 NOTE — Progress Notes (Signed)
Brandon Chang    [id number]    30-May-1965  Primary Care Physician:System, Pcp Not In  Referring Physician: No referring provider defined for this encounter.  Chief complaint: Dyspnea, cough, occupational exposure  HPI: 52 year old with smoker with no significant history.  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  He reports that the bags had split open spilling the material in his truck and he was involved in the cleanup with no mask or other protection.  Reports developing cough, wheezing, dyspnea afterwards.  Denies any fevers, chills, sputum production, hemoptysis. He was evaluated in the urgent care and emergency room at [facility] end of December 2018 and Jan with a chest x-ray that was clear.  He was treated with ciprofloxacin, duo nebs and multiple rounds of prednisone.  He reports persistent symptoms and was seen again at urgent care at [facility] on 04/08/17.    Referrred to pulmonary 04/20/17 for workman's compensation and disability review.  The work-up has consisted of CT scan which shows nonspecific groundglass opacities at the base.  We are unable to do PFTs due to inability to follow instruction, cough  Pets: 3 dogs, no birds, cats, farm animals Occupation: Long distance truck driver. Currently on disability Exposures: Exposure as noted above.  No mold.  Has a hot tub at home Smoking history: 10-pack-year smoking history.  Smokes a cigar every day.  Quit smoking in Jan 2019 Travel History: Lives in [place].  Travels all over the country for his work.  Interim history: Started on trelegy at last visit and given a prednisone taper. He feels that the trelegy is helping with symptoms but cannot tell if the prednisone made any difference Continues to have non productive cough.   Outpatient Encounter Medications as of 11/10/2017    Medication Sig  . albuterol (PROVENTIL HFA;VENTOLIN HFA) 108 (90 Base) MCG/ACT inhaler Inhale 2 puffs into the lungs every 6 (six) hours as needed for wheezing or shortness of breath.  . dexamethasone (DECADRON) 2 MG tablet   . fluticasone (FLONASE) 50 MCG/ACT nasal spray Place 2 sprays into both nostrils daily.  Marland Kitchen LORazepam (ATIVAN) 1 MG tablet Take 1 mg by mouth every 8 (eight) hours.  Marland Kitchen omeprazole (PRILOSEC) 20 MG capsule Take 1 capsule (20 mg total) by mouth 2 (two) times daily.  . TRELEGY ELLIPTA 100-62.5-25 MCG/INH AEPB INHALE 1 PUFF ONCE DAILY  . [DISCONTINUED] predniSONE (DELTASONE) 20 MG tablet Take 2 tablets (40mg ) daily  . benzonatate (TESSALON) 200 MG capsule Take 1 capsule (200 mg total) by mouth 3 (three) times daily as needed for cough. (Patient not taking: Reported on 11/10/2017)  . chlorpheniramine (CHLOR-TRIMETON) 4 MG tablet Take 2 tablets (8 mg total) by mouth 3 (three) times daily. (Patient not taking: Reported on 11/10/2017)  . [DISCONTINUED] budesonide-formoterol (SYMBICORT) 160-4.5 MCG/ACT inhaler Inhale 2 puffs into the lungs 2 (two) times daily.   No facility-administered encounter medications on file as of 11/10/2017.    Physical Exam: Blood pressure (!) 132/58, pulse 87, height 5\' 8"  (1.727 m), weight 210 lb (95.3 kg), SpO2 96 %. Gen:      No acute distress HEENT:  EOMI, sclera anicteric Neck:     No masses; no thyromegaly Lungs:    Clear to auscultation bilaterally; normal respiratory effort CV:         Regular rate and rhythm;  no murmurs Abd:      + bowel sounds; soft, non-tender; no palpable masses, no distension Ext:    No edema; adequate peripheral perfusion Skin:      Warm and dry; no rash Neuro: alert and oriented x 3 Psych: normal mood and affect  Data Reviewed: Imaging Chest x-ray 02/14/17- clear lungs, no interstitial process Chest x-ray 04/08/17- mild increase in interstitial markings.  CT chest 05/02/17-minimal groundglass opacities, basilar  atelectasis.  6 mm left lower lobe nodule. CT high-resolution 09/01/2017-stable minimal groundglass opacities, bibasilar atelectasis, stable lung nodule. I have reviewed the images personally.  PFTs Spirometry 02/28/17 FVC 2.48 [49%], FEV1 2.22 [57%], F/F 89 No obstruction, likely has restriction based on reduction in FVC.  FENO 04/21/17- unable to complete PFTs 05/19/17- unable to complete PFTs 06/20/17- unable to complete PFTs 11/10/17- unable to complete  ABG 02/14/17-7.49/33/172/100%  Assessment:  Follow up for dyspnea, cough Developed after inhalational exposure to Woll[place]ite in dec 2018.  Review of literature shows that it is substitute for asbestos however there is no recorded instances of interstitial lung disease, pulmonary fibrosis after exposure. High-resolution CT reviewed with mild groundglass opacities which is not convincing for interstitial lung disease  He could have bronchitis from exposure, suspect underlying COPD given his smoking history but we have not been able to get PFTs due to cough. Has persistent cough which is likely exacerbated by postnasal drip and silent reflux   Continue trelegy, albuterol as needed Ambulated in office today with O2 sats remaining around 100%.  No evidence of hypoxia. He will be ok to return to work from a pulmonary stand point.  Upper airway cough, postnasal drip, silent reflux Continue Prilosec, antihistamine treatment with Chlor-Trimeton and Flonase nasal spray Tessalon for cough  Subcentimeter pulmonary nodule Follow-up CT in 1 year for evaluation  Plan/Recommendations: - Continue trelegy, albuterol as needed - Prilosec, Chlor-Trimeton, Flonase - Tessalon for cough  Brandon GreathousePraveen Adraine Biffle MD [facility] 11/10/2017, 12:06 PM  CC: No ref. provider found

## 2017-11-10 NOTE — Progress Notes (Signed)
Pt could not complete PFT due to cough. Spirometry was attempted and pt was not even able to complete 3 tidal breaths before coughing vigorously until gagging.

## 2017-11-10 NOTE — Patient Instructions (Addendum)
Continue using the trelegy inhaler as prescribed Continue the Chlor-Trimeton, Flonase, Prilosec We will need a follow-up CT in July 2020 to monitor the lung nodule  Follow-up in clinic after CT scan.

## 2017-11-14 ENCOUNTER — Telehealth: Payer: Self-pay | Admitting: Pulmonary Disease

## 2017-11-14 NOTE — Telephone Encounter (Signed)
Can not call Melissa back due to the fax being placed as the phone number and fax number. Will fax over notes per previous message. Will close this encounter.

## 2017-11-15 ENCOUNTER — Telehealth: Payer: Self-pay | Admitting: Pulmonary Disease

## 2017-11-15 NOTE — Telephone Encounter (Signed)
Called and spoke to Brandon Chang. She received the last OV note that was faxed over yesterday. She will be sending a form for Dr. Isaiah SergeMannam to fill out to either release patient back to work or to give restrictions. Per last OV Dr. Isaiah SergeMannam stated that patient was able to return to work from the pulmonary standpoint.  Routing to Dr. Isaiah SergeMannam and Delray AltMargie as Lorain ChildesFYI.

## 2017-11-15 NOTE — Telephone Encounter (Signed)
lmtcb x1 for Melissa. 

## 2017-11-15 NOTE — Telephone Encounter (Signed)
Brandon Chang is calling back [phone] She need the Work Status form from the last date of service 09/19Fax#[phone]

## 2017-11-21 NOTE — Telephone Encounter (Signed)
Forms have not been received as of yet.  lmtcb x1 for Melissa for update.

## 2017-11-23 NOTE — Telephone Encounter (Signed)
Called and spoke to [place], she does not need anymore information at this time. She is waiting on a decision from the claim adjuster. She states she will call back if anything else is needed. Nothing further needed at this time.

## 2018-09-22 ENCOUNTER — Ambulatory Visit (INDEPENDENT_AMBULATORY_CARE_PROVIDER_SITE_OTHER)
Admission: RE | Admit: 2018-09-22 | Discharge: 2018-09-22 | Disposition: A | Discharge status: Home / Self Care | Payer: Self-pay | Source: Ambulatory Visit | Attending: Pulmonary Disease | Admitting: Pulmonary Disease

## 2018-09-22 ENCOUNTER — Other Ambulatory Visit: Payer: Self-pay

## 2018-09-22 DIAGNOSIS — R911 Solitary pulmonary nodule: Secondary | ICD-10-CM

## 2018-09-27 ENCOUNTER — Telehealth: Payer: Self-pay | Admitting: Pulmonary Disease

## 2018-09-27 NOTE — Telephone Encounter (Signed)
Pt called requesting to know the results of the CT which was performed 7/31. Tonya, please advise on this for pt. Thanks!

## 2018-09-27 NOTE — Telephone Encounter (Signed)
CT showed unchanged appearance of small left lung nodules. These are all less than 5 mm and require no further follow-up needed at this time.

## 2018-09-27 NOTE — Telephone Encounter (Signed)
Called and spoke with pt letting him know the results of the ct. Pt verbalized understanding. Nothing further needed. 

## 2018-10-18 ENCOUNTER — Telehealth: Payer: Self-pay | Admitting: Pulmonary Disease

## 2018-10-18 NOTE — Telephone Encounter (Signed)
Called patient to go over CT results. Shared Dr. Vito Backers. Patient verbalized understanding Nothing further needed at this time.

## 2018-12-10 IMAGING — CT CT CHEST HIGH RESOLUTION W/O CM
2 of 8 series · 15 of 36 positions shown, 18 images · non-contrast
Comparison: None.

CLINICAL DATA: 52-year-old male with history of severe worsening
cough since January 2017. Inhalation incident at work exposed to
calcium metasilicate for several days.

EXAM:
CT CHEST WITHOUT CONTRAST
TECHNIQUE: Multidetector CT imaging of the chest was performed following the
standard protocol without intravenous contrast. High resolution
imaging of the lungs, as well as inspiratory and expiratory imaging,
was performed.

[Series 2: high resolution · axial · 0.64mm/px · z∈[-325,-43]mm · 12 of 157 slices shown, 15 images]
[im 8/157  mediastinal]
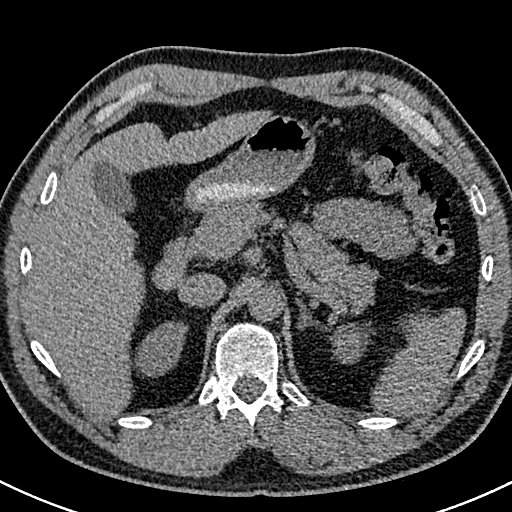
[im 8/157  lung]
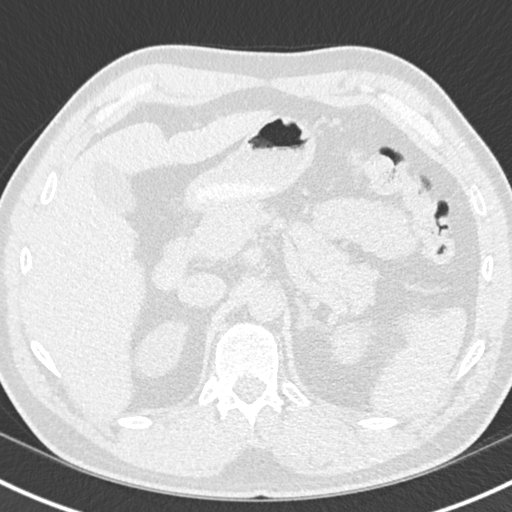
[im 23/157  lung]
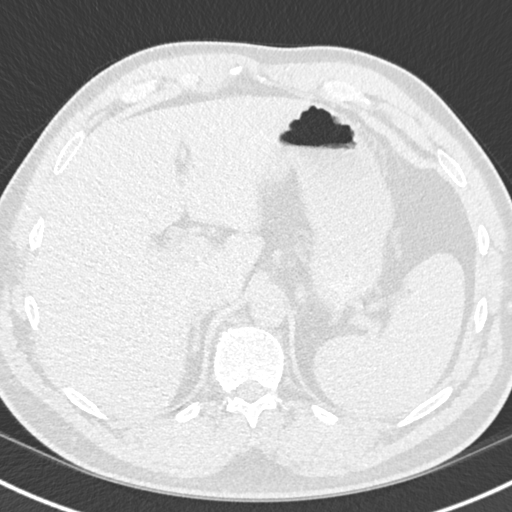
[im 38/157  lung]
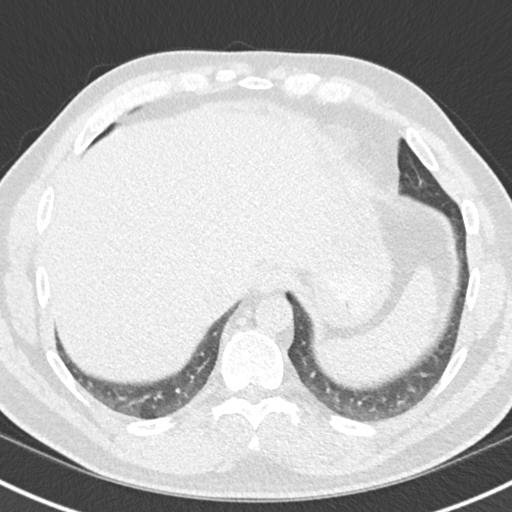
[im 45/157  lung]
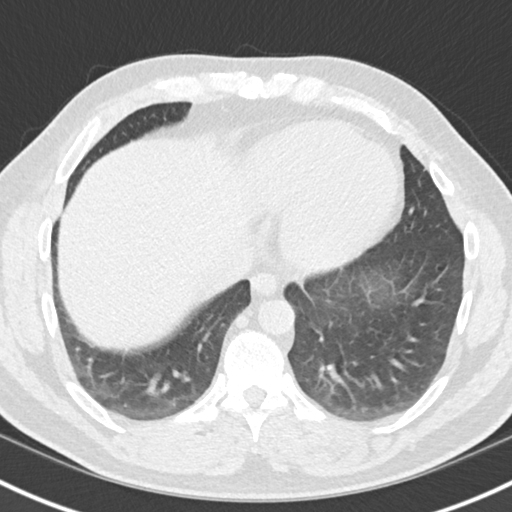
[im 60/157  mediastinal]
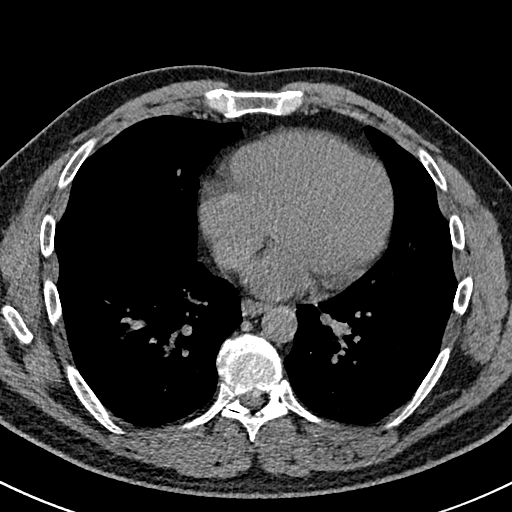
[im 60/157  lung]
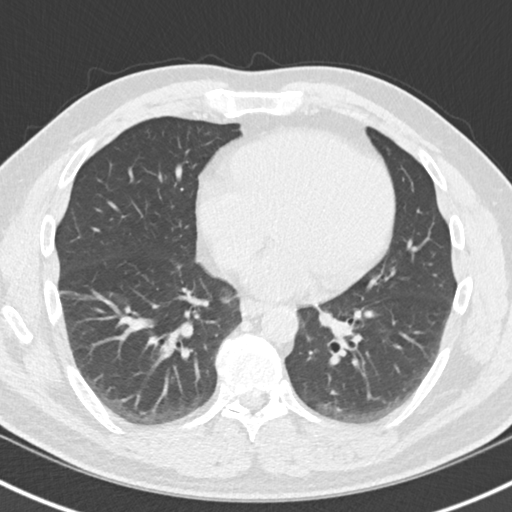
[im 75/157  lung]
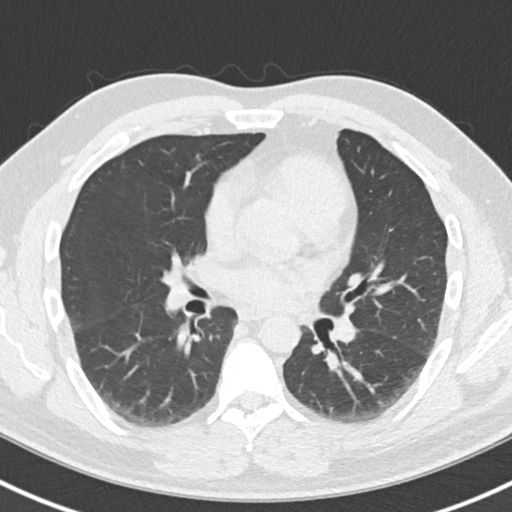
[im 82/157  lung]
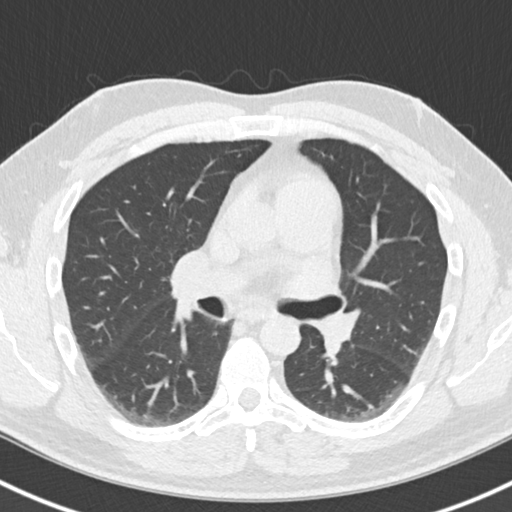
[im 97/157  lung]
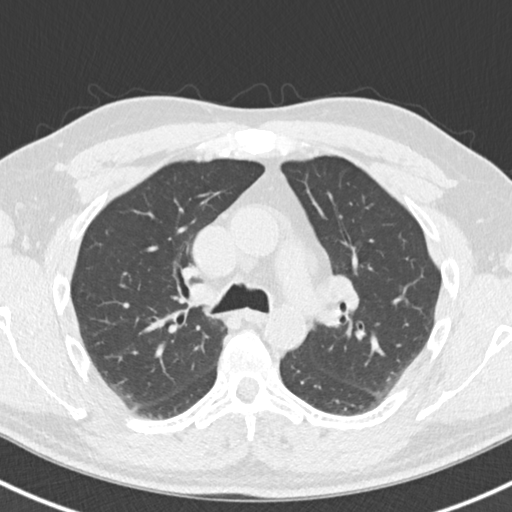
[im 112/157  mediastinal]
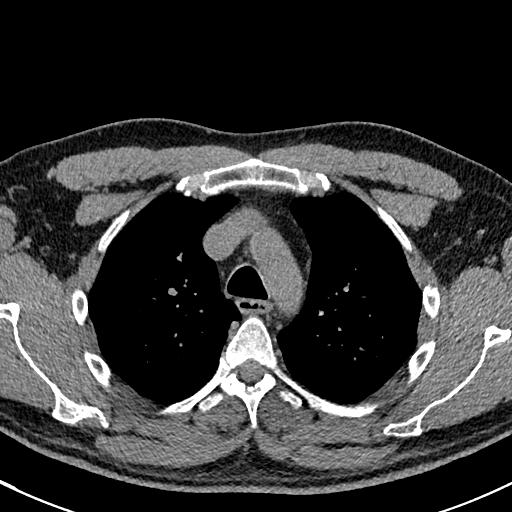
[im 112/157  lung]
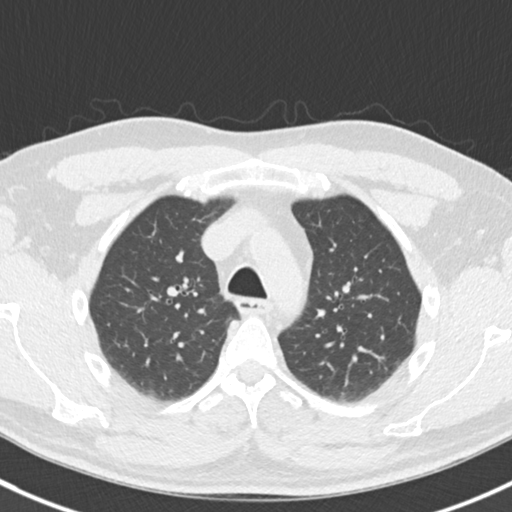
[im 119/157  lung]
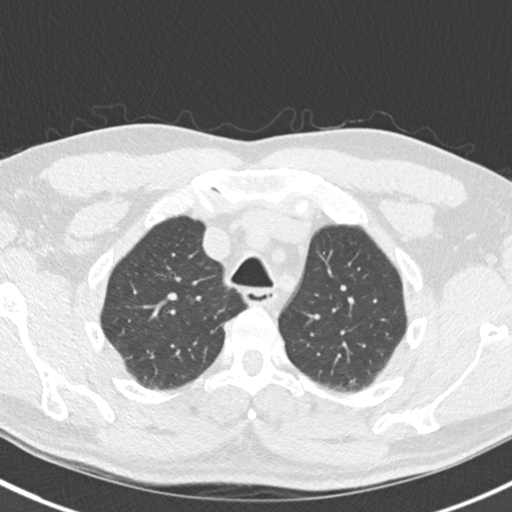
[im 134/157  lung]
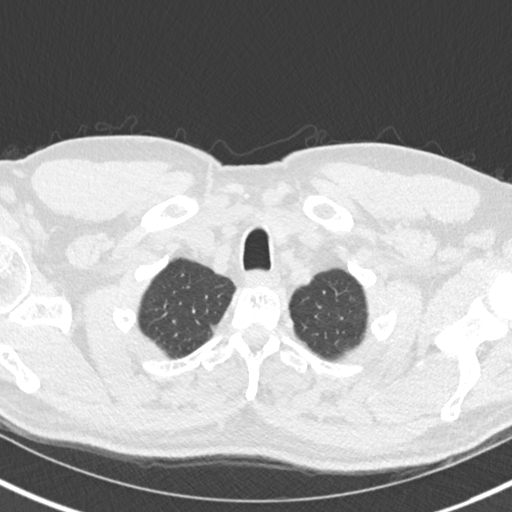
[im 149/157  lung]
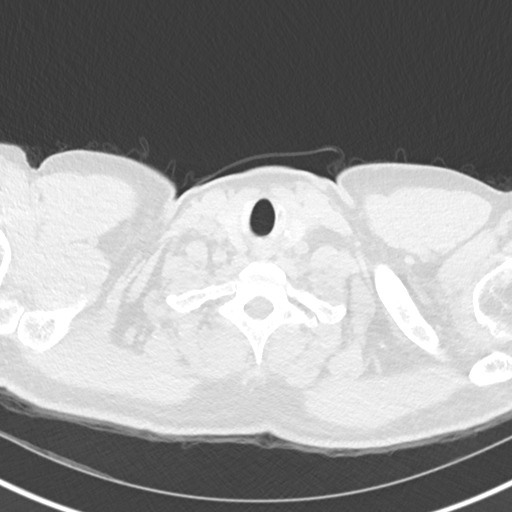

[Series 11: coronal · coronal · 0.61mm/px · 3 of 128 slices shown]
[im 26/128  lung]
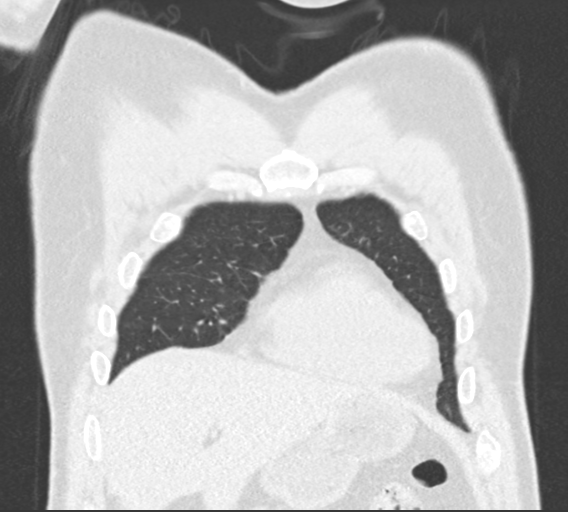
[im 51/128  lung]
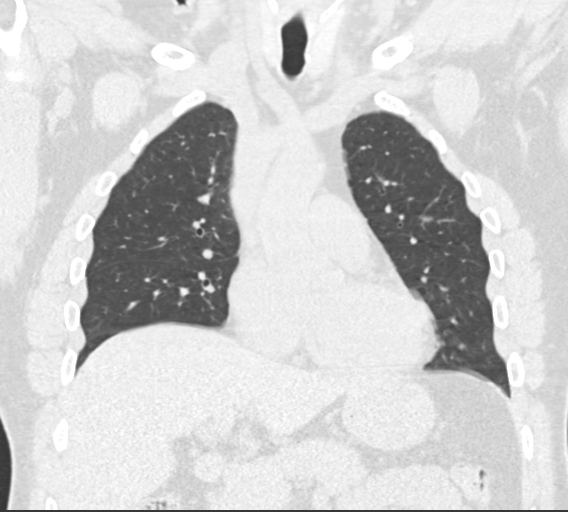
[im 77/128  lung]
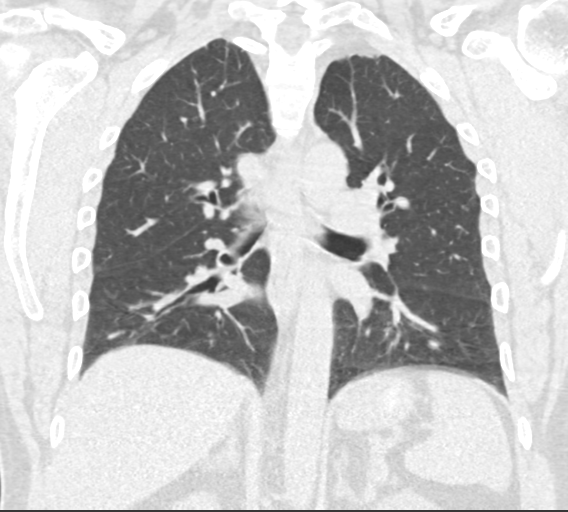

[15 of 36 positions shown; findings below may reference images not displayed]

FINDINGS: Cardiovascular: Heart size is normal. There is no significant
pericardial fluid, thickening or pericardial calcification. No
atherosclerotic calcifications are noted in the thoracic aorta or
the coronary arteries.

Mediastinum/Nodes: No pathologically enlarged mediastinal or hilar
lymph nodes. Please note that accurate exclusion of hilar adenopathy
is limited on noncontrast CT scans. Esophagus is unremarkable in
appearance. No axillary lymphadenopathy.

Lungs/Pleura: 6 mm ground-glass attenuation nodule in the left lower
lobe (axial image 110 of series 3). No larger more suspicious
appearing pulmonary nodules or masses are noted. High-resolution
images demonstrate some very mild diffuse peripheral predominant
ground-glass attenuation scattered throughout the lungs bilaterally,
most evident throughout the mid to lower lungs. No associated septal
thickening, subpleural reticulation, traction bronchiectasis or
honeycombing. Inspiratory and expiratory image is unremarkable. No
acute consolidative airspace disease. No pleural effusions.

Upper Abdomen: Unremarkable.

Musculoskeletal: There are no aggressive appearing lytic or blastic
lesions noted in the visualized portions of the skeleton.
IMPRESSION: 1. Very subtle pattern of peripheral predominant ground-glass
attenuation scattered throughout the lungs bilaterally. This could
be a manifestation of interstitial lung disease, and at this time CT
features are most consistent with non IPF diagnosis. Repeat
high-resolution chest CT is recommended in 12 months to assess for
temporal changes in the appearance of the lung parenchyma.
2. 6 mm ground-glass attenuation nodule in the left lower lobe.
Attention at time of repeat high-resolution chest CT is recommended
to ensure stability. If persistent, repeat CT is recommended every 2
years until 5 years of stability has been established. This
recommendation follows the consensus statement: Guidelines for
Management of Incidental Pulmonary Nodules Detected on CT Images:
